# Patient Record
Sex: Male | Born: 1990 | Race: White | Hispanic: No | Marital: Single | Smoking: Never smoker
Health system: Southern US, Community
[De-identification: ages and names within clinical notes are randomized; demographics above are authoritative.]

## PROBLEM LIST (undated history)

## (undated) HISTORY — PX: TONSILLECTOMY: SUR1361

---
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
## 2012-04-22 IMAGING — CR NASAL BONES - 3+ VIEW
1 series · 3 of 3 positions shown · non-contrast
Comparison: none

REASON FOR EXAM: injury; pt in [place]
COMMENTS:

PROCEDURE:     DXR - DXR NASAL BONES  - December 10, 2010  [DATE]
RESULT:     Images of the nasal bones demonstrate minimally displaced
fracture. The nasal septum appears midline. The orbits appear intact on the
Waters' view.

[Series 1: view not recorded · 0.17mm/px · 3 of 3 slices shown]
[im 1/3]
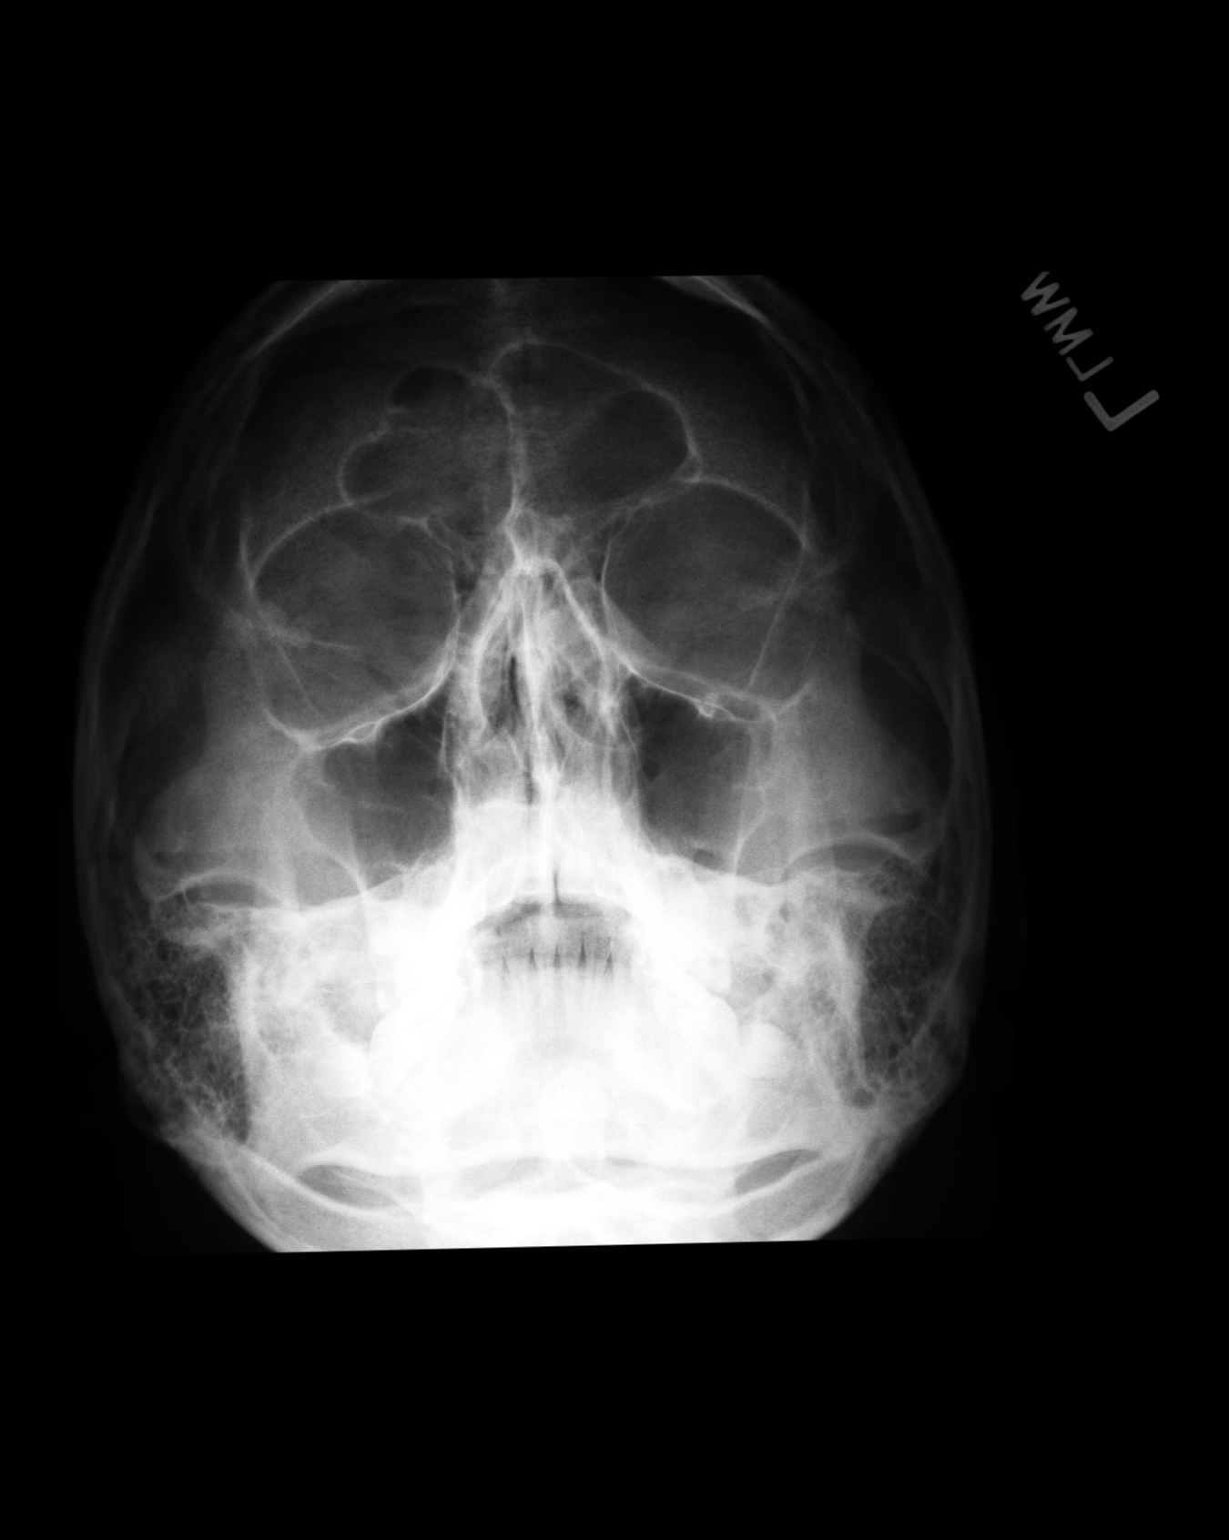
[im 2/3]
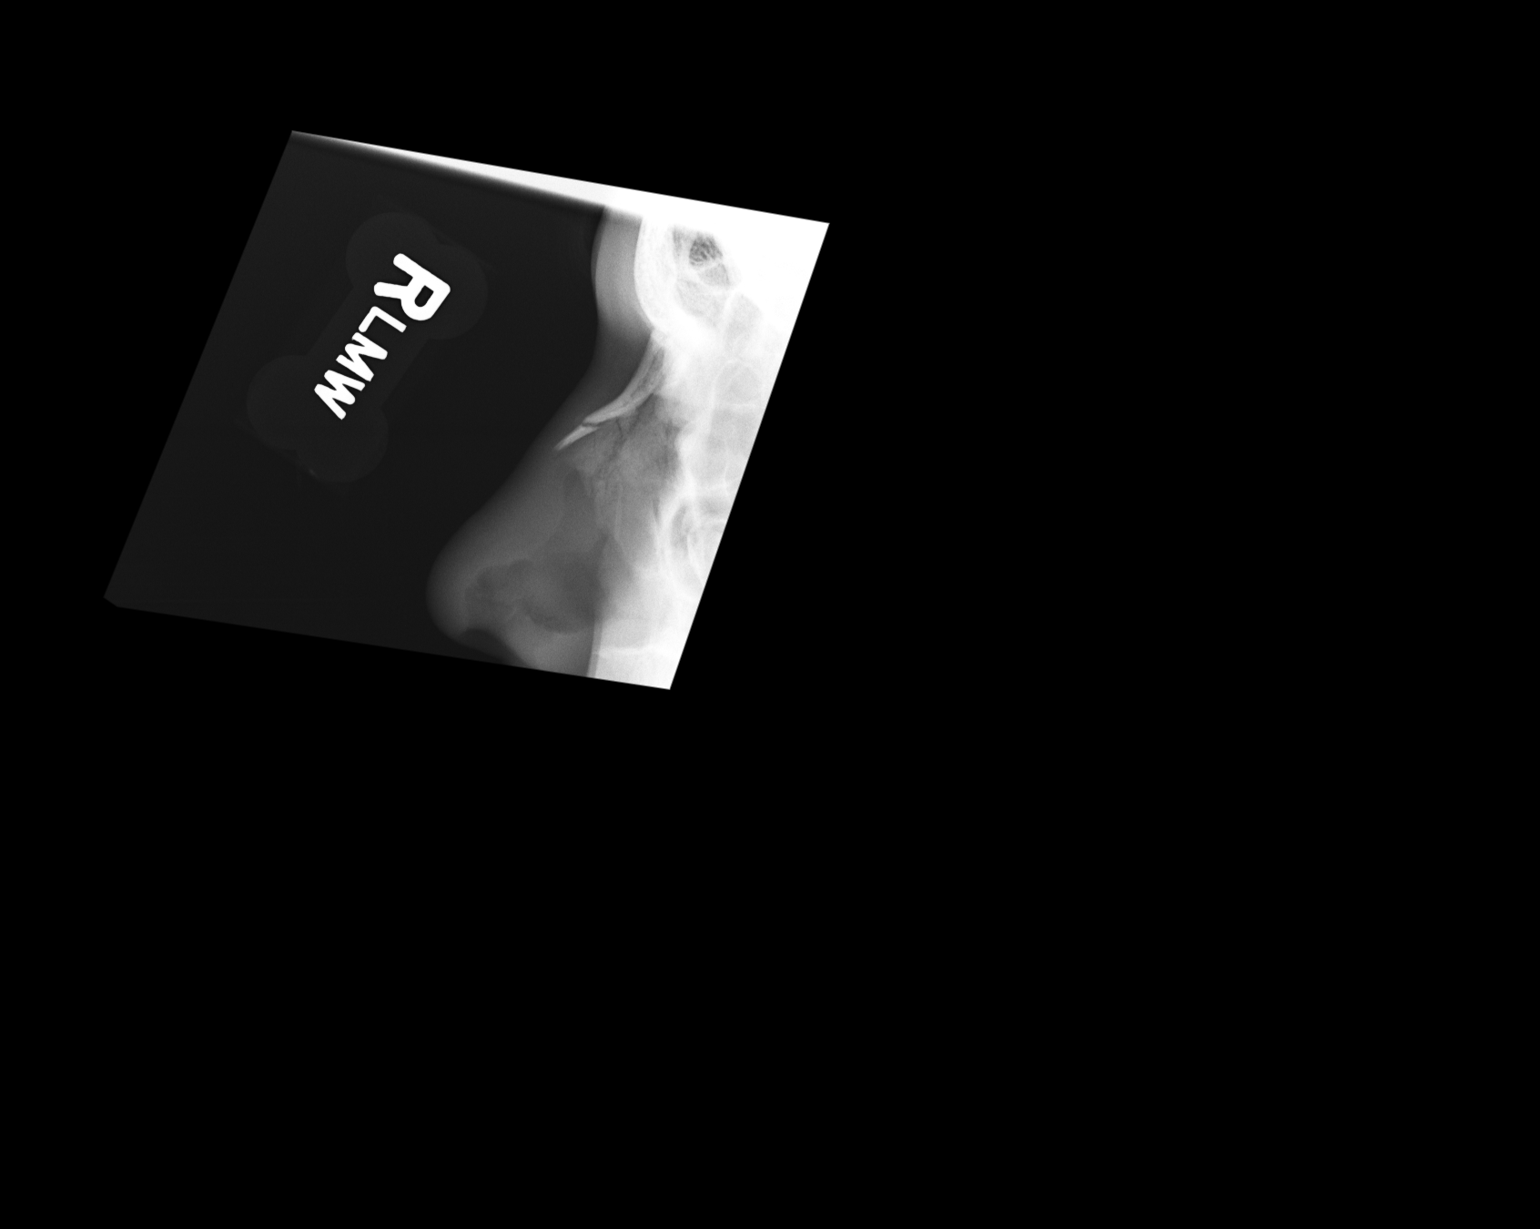
[im 3/3]
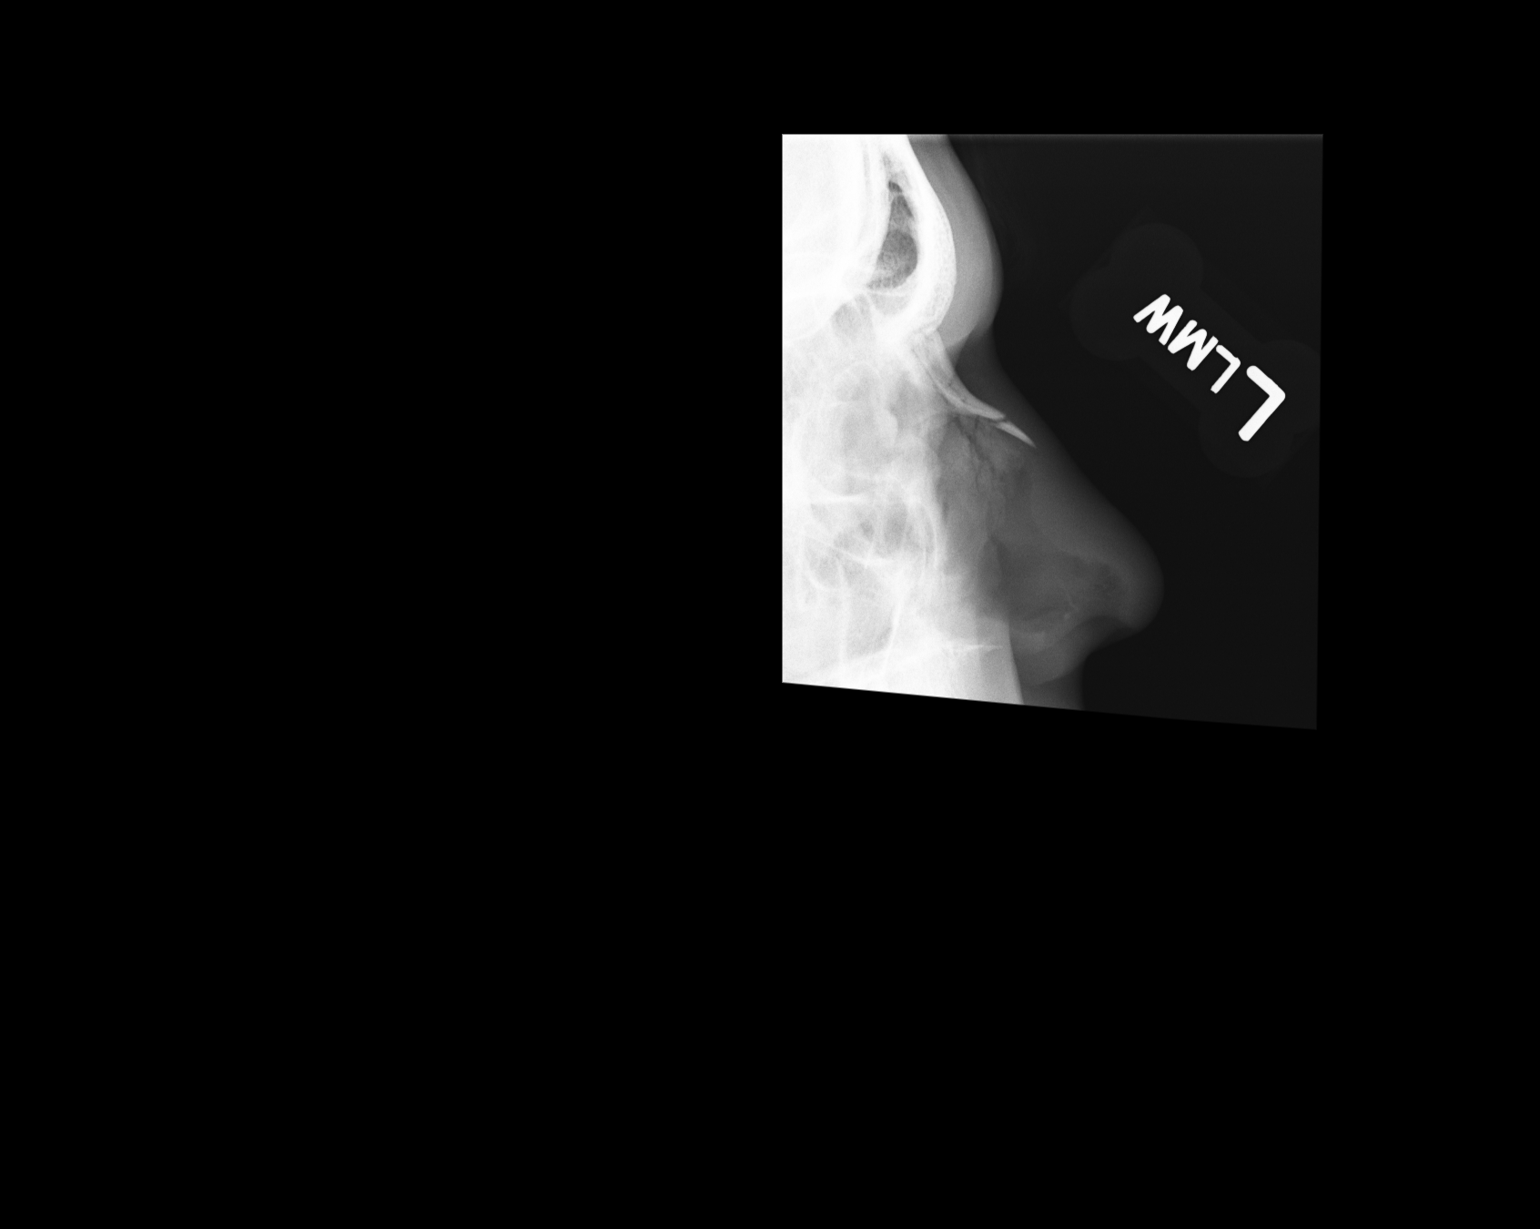

[3 of 3 positions shown; findings below may reference images not displayed]

IMPRESSION: Nasal bone fracture.

## 2012-11-11 ENCOUNTER — Ambulatory Visit: Payer: Self-pay

## 2012-11-11 LAB — RAPID STREP-A WITH REFLX: Micro Text Report: NEGATIVE

## 2012-11-11 LAB — RAPID INFLUENZA A&B ANTIGENS

## 2012-11-14 LAB — BETA STREP CULTURE(ARMC)

## 2013-04-07 ENCOUNTER — Ambulatory Visit: Payer: Self-pay | Admitting: Emergency Medicine

## 2013-04-07 LAB — RAPID STREP-A WITH REFLX: Micro Text Report: NEGATIVE

## 2013-04-07 LAB — RAPID INFLUENZA A&B ANTIGENS

## 2013-04-10 LAB — BETA STREP CULTURE(ARMC)

## 2015-01-07 ENCOUNTER — Ambulatory Visit: Payer: Self-pay | Admitting: Emergency Medicine

## 2017-01-26 ENCOUNTER — Ambulatory Visit
Admission: EM | Admit: 2017-01-26 | Discharge: 2017-01-26 | Disposition: A | Discharge status: Home / Self Care | Payer: Federal, State, Local not specified - PPO | Attending: Family Medicine | Admitting: Family Medicine

## 2017-01-26 ENCOUNTER — Encounter: Payer: Self-pay | Admitting: *Deleted

## 2017-01-26 DIAGNOSIS — J0100 Acute maxillary sinusitis, unspecified: Secondary | ICD-10-CM | POA: Diagnosis not present

## 2017-01-26 DIAGNOSIS — H6503 Acute serous otitis media, bilateral: Secondary | ICD-10-CM

## 2017-01-26 LAB — RAPID STREP SCREEN (MED CTR MEBANE ONLY): Streptococcus, Group A Screen (Direct): NEGATIVE

## 2017-01-26 MED ORDER — AMOXICILLIN 875 MG PO TABS: 875.0000 mg | ORAL_TABLET | Freq: Two times a day (BID) | ORAL | 0 refills | Status: DC

## 2017-01-26 NOTE — ED Provider Notes (Signed)
[facility]    CSN: [id number] Arrival date & time: 01/26/17  [id number]     History   Chief Complaint Chief Complaint  Patient presents with  . Sore Throat  . Facial Pain  . Generalized Body Aches  . Nasal Congestion    HPI Shawn Solis is a 26 y.o. male.   The history is provided by the patient.  Sore Throat   URI  Presenting symptoms: congestion, facial pain and rhinorrhea   Severity:  Moderate Onset quality:  Sudden Duration:  7 days Timing:  Constant Progression:  Worsening Chronicity:  New Relieved by:  None tried Ineffective treatments:  None tried Associated symptoms: sinus pain   Associated symptoms: no wheezing   Risk factors: not elderly, no chronic cardiac disease, no chronic kidney disease, no chronic respiratory disease, no diabetes mellitus, no immunosuppression, no recent illness, no recent travel and no sick contacts     History reviewed. No pertinent past medical history.  There are no active problems to display for this patient.   Past Surgical History:  Procedure Laterality Date  . TONSILLECTOMY         Home Medications    Prior to Admission medications   Medication Sig Start Date End Date Taking? Authorizing Provider  amoxicillin (AMOXIL) 875 MG tablet Take 1 tablet (875 mg total) by mouth 2 (two) times daily. 01/26/17   Shawn Mccallumrlando Any Mcneice, MD    Family History History reviewed. No pertinent family history.  Social History Social History  Substance Use Topics  . Smoking status: Never Smoker  . Smokeless tobacco: Never Used  . Alcohol use Yes     Allergies   Patient has no known allergies.   Review of Systems Review of Systems  HENT: Positive for congestion, rhinorrhea and sinus pain.   Respiratory: Negative for wheezing.      Physical Exam Triage Vital Signs ED Triage Vitals  Enc Vitals Group     BP 01/26/17 0901 125/86     Pulse Rate 01/26/17 0901 86     Resp 01/26/17 0901 16     Temp 01/26/17 0901  97.8 F (36.6 C)     Temp Source 01/26/17 0901 Oral     SpO2 01/26/17 0901 98 %     Weight 01/26/17 0904 230 lb (104.3 kg)     Height 01/26/17 0904 6\' 3"  (1.905 m)     Head Circumference --      Peak Flow --      Pain Score 01/26/17 0935 2     Pain Loc --      Pain Edu? --      Excl. in GC? --    No data found.   Updated Vital Signs BP 125/86 (BP Location: Left Arm)   Pulse 86   Temp 97.8 F (36.6 C) (Oral)   Resp 16   Ht 6\' 3"  (1.905 m)   Wt 230 lb (104.3 kg)   SpO2 98%   BMI 28.75 kg/m   Visual Acuity Right Eye Distance:   Left Eye Distance:   Bilateral Distance:    Right Eye Near:   Left Eye Near:    Bilateral Near:     Physical Exam  Constitutional: He appears well-developed and well-nourished. No distress.  HENT:  Head: Normocephalic and atraumatic.  Right Ear: External ear and ear canal normal. Tympanic membrane is erythematous and bulging. A middle ear effusion is present.  Left Ear: External ear and ear canal normal. Tympanic membrane  is erythematous and bulging. A middle ear effusion is present.  Nose: Right sinus exhibits maxillary sinus tenderness and frontal sinus tenderness. Left sinus exhibits maxillary sinus tenderness and frontal sinus tenderness.  Mouth/Throat: Uvula is midline, oropharynx is clear and moist and mucous membranes are normal. No oropharyngeal exudate or tonsillar abscesses.  Eyes: Conjunctivae and EOM are normal. Pupils are equal, round, and reactive to light. Right eye exhibits no discharge. Left eye exhibits no discharge. No scleral icterus.  Neck: Normal range of motion. Neck supple. No tracheal deviation present. No thyromegaly present.  Cardiovascular: Normal rate, regular rhythm and normal heart sounds.   Pulmonary/Chest: Effort normal and breath sounds normal. No stridor. No respiratory distress. He has no wheezes. He has no rales. He exhibits no tenderness.  Lymphadenopathy:    He has no cervical adenopathy.  Neurological: He  is alert.  Skin: Skin is warm and dry. No rash noted. He is not diaphoretic.  Nursing note and vitals reviewed.    UC Treatments / Results  Labs (all labs ordered are listed, but only abnormal results are displayed) Labs Reviewed  RAPID STREP SCREEN (NOT AT [facility])  CULTURE, GROUP A STREP [facility])    EKG  EKG Interpretation None       Radiology No results found.  Procedures Procedures (including critical care time)  Medications Ordered in UC Medications - No data to display   Initial Impression / Assessment and Plan / UC Course  I have reviewed the triage vital signs and the nursing notes.  Pertinent labs & imaging results that were available during my care of the patient were reviewed by me and considered in my medical decision making (see chart for details).       Final Clinical Impressions(s) / UC Diagnoses   Final diagnoses:  Bilateral acute serous otitis media, recurrence not specified  Acute maxillary sinusitis, recurrence not specified    New Prescriptions Discharge Medication List as of 01/26/2017  9:33 AM    START taking these medications   Details  amoxicillin (AMOXIL) 875 MG tablet Take 1 tablet (875 mg total) by mouth 2 (two) times daily., Starting Fri 01/26/2017, Normal       1. diagnosis reviewed with patient 2. rx as per orders above; reviewed possible side effects, interactions, risks and benefits  3. Recommend supportive treatment with rest, fluids 4. Follow-up prn if symptoms worsen or don't improve   Shawn Mccallum, MD 01/26/17 1230

## 2017-01-26 NOTE — ED Triage Notes (Signed)
Sore throat, facial pain, head congestion, ear fullness, body aches, x1 week.

## 2017-01-28 ENCOUNTER — Telehealth: Payer: Self-pay

## 2017-01-28 LAB — CULTURE, GROUP A STREP (THRC)

## 2017-01-28 NOTE — Telephone Encounter (Signed)
Spoke with pt. Pt. Advised of all results and verbalized understanding. MAH 

## 2020-10-10 ENCOUNTER — Other Ambulatory Visit: Payer: Self-pay

## 2020-10-10 ENCOUNTER — Encounter: Payer: Self-pay | Admitting: Emergency Medicine

## 2020-10-10 ENCOUNTER — Ambulatory Visit
Admission: EM | Admit: 2020-10-10 | Discharge: 2020-10-10 | Disposition: A | Discharge status: Home / Self Care | Payer: Managed Care, Other (non HMO) | Attending: Physician Assistant | Admitting: Physician Assistant

## 2020-10-10 DIAGNOSIS — B349 Viral infection, unspecified: Secondary | ICD-10-CM | POA: Diagnosis not present

## 2020-10-10 DIAGNOSIS — Z20822 Contact with and (suspected) exposure to covid-19: Secondary | ICD-10-CM | POA: Insufficient documentation

## 2020-10-10 DIAGNOSIS — B001 Herpesviral vesicular dermatitis: Secondary | ICD-10-CM | POA: Insufficient documentation

## 2020-10-10 DIAGNOSIS — R059 Cough, unspecified: Secondary | ICD-10-CM | POA: Insufficient documentation

## 2020-10-10 DIAGNOSIS — J029 Acute pharyngitis, unspecified: Secondary | ICD-10-CM | POA: Diagnosis not present

## 2020-10-10 LAB — RESP PANEL BY RT-PCR (FLU A&B, COVID) ARPGX2
Influenza A by PCR: NEGATIVE
Influenza B by PCR: NEGATIVE
SARS Coronavirus 2 by RT PCR: NEGATIVE

## 2020-10-10 LAB — GROUP A STREP BY PCR: Group A Strep by PCR: NOT DETECTED

## 2020-10-10 MED ORDER — VALACYCLOVIR HCL 1 G PO TABS: 2000.0000 mg | ORAL_TABLET | Freq: Two times a day (BID) | ORAL | 0 refills | Status: AC

## 2020-10-10 NOTE — Discharge Instructions (Signed)

## 2020-10-10 NOTE — ED Provider Notes (Signed)
[facility]    CSN: [id number] Arrival date & time: 10/10/20  1037      History   Chief Complaint Chief Complaint  Patient presents with  . Nasal Congestion  . Sore Throat    HPI Shawn Solis is a 29 y.o. male presenting for 2 day history of sore throat, dry cough, nasal congestion and headaches.  Patient says that he developed multiple cold sores on his lips yesterday.  He says he noticed new culture today.  Patient states that he thought his symptoms were due to allergies and has been taking allergy medicine which seems to have helped.  He is also afraid he may develop a sinus infection since he says he has a history of getting sinus infections.  Patient is most concerned about his cold sores at this point since he says they are painful and seem to be spreading.  He has applied over-the-counter Abreva without any improvement in the sores.  He denies any known Covid exposure. Patient not vaccinated for COVID 19.  Patient denies any associated fever, fatigue, body aches, sinus pain, ear pain, chest pain or breathing difficulty.  Denies any nausea/vomiting or diarrhea.  No smell or taste changes.  No other complaints or concerns.  HPI  History reviewed. No pertinent past medical history.  There are no problems to display for this patient.   Past Surgical History:  Procedure Laterality Date  . TONSILLECTOMY         Home Medications    Prior to Admission medications   Medication Sig Start Date End Date Taking? Authorizing Provider  valACYclovir (VALTREX) 1000 MG tablet Take 2 tablets (2,000 mg total) by mouth 2 (two) times daily for 1 day. 10/10/20 10/11/20  Shirlee Latch, PA-C    Family History History reviewed. No pertinent family history.  Social History Social History   Tobacco Use  . Smoking status: Never Smoker  . Smokeless tobacco: Never Used  Vaping Use  . Vaping Use: Never used  Substance Use Topics  . Alcohol use: Yes  . Drug use: Never      Allergies   Patient has no known allergies.   Review of Systems Review of Systems  Constitutional: Positive for fatigue. Negative for fever.  HENT: Positive for congestion, mouth sores (lip sores), rhinorrhea and sore throat. Negative for sinus pressure and sinus pain.   Respiratory: Positive for cough. Negative for shortness of breath.   Gastrointestinal: Negative for abdominal pain, diarrhea, nausea and vomiting.  Musculoskeletal: Negative for myalgias.  Neurological: Positive for headaches. Negative for weakness and light-headedness.  Hematological: Negative for adenopathy.     Physical Exam Triage Vital Signs ED Triage Vitals  Enc Vitals Group     BP 10/10/20 1145 (!) 135/107     Pulse Rate 10/10/20 1145 (!) 105     Resp 10/10/20 1145 16     Temp 10/10/20 1145 98.3 F (36.8 C)     Temp Source 10/10/20 1145 Oral     SpO2 10/10/20 1145 100 %     Weight 10/10/20 1142 215 lb (97.5 kg)     Height 10/10/20 1142 6\' 3"  (1.905 m)     Head Circumference --      Peak Flow --      Pain Score 10/10/20 1142 2     Pain Loc --      Pain Edu? --      Excl. in GC? --    No data found.  Updated  Vital Signs BP (!) 135/107 (BP Location: Right Arm)   Pulse (!) 105   Temp 98.3 F (36.8 C) (Oral)   Resp 16   Ht 6\' 3"  (1.905 m)   Wt 215 lb (97.5 kg)   SpO2 100%   BMI 26.87 kg/m       Physical Exam Vitals and nursing note reviewed.  Constitutional:      General: He is not in acute distress.    Appearance: Normal appearance. He is well-developed. He is not ill-appearing, toxic-appearing or diaphoretic.  HENT:     Head: Normocephalic and atraumatic.     Nose: Congestion and rhinorrhea present.     Mouth/Throat:     Pharynx: Uvula midline. Posterior oropharyngeal erythema present. No oropharyngeal exudate.     Tonsils: No tonsillar abscesses.     Comments: Multiple tender vesicles of upper and lower lips Eyes:     General: No scleral icterus.       Right eye: No  discharge.        Left eye: No discharge.     Conjunctiva/sclera: Conjunctivae normal.  Neck:     Thyroid: No thyromegaly.     Trachea: No tracheal deviation.  Cardiovascular:     Rate and Rhythm: Regular rhythm. Tachycardia present.     Heart sounds: Normal heart sounds.  Pulmonary:     Effort: Pulmonary effort is normal. No respiratory distress.     Breath sounds: Normal breath sounds. No stridor. No wheezing or rales.  Chest:     Chest wall: No tenderness.  Musculoskeletal:     Cervical back: Normal range of motion and neck supple.  Lymphadenopathy:     Cervical: No cervical adenopathy.  Skin:    General: Skin is warm and dry.     Findings: No rash.  Neurological:     General: No focal deficit present.     Mental Status: He is alert. Mental status is at baseline.     Motor: No weakness.     Gait: Gait normal.  Psychiatric:        Mood and Affect: Mood normal.        Behavior: Behavior normal.        Thought Content: Thought content normal.      UC Treatments / Results  Labs (all labs ordered are listed, but only abnormal results are displayed) Labs Reviewed  GROUP A STREP BY PCR  RESP PANEL BY RT-PCR (FLU A&B, COVID) [id number]    EKG   Radiology No results found.  Procedures Procedures (including critical care time)  Medications Ordered in UC Medications - No data to display  Initial Impression / Assessment and Plan / UC Course  I have reviewed the triage vital signs and the nursing notes.  Pertinent labs & imaging results that were available during my care of the patient were reviewed by me and considered in my medical decision making (see chart for details).   29-year male presenting for 4-day history of cough, congestion, sore throat and new onset of cold sores yesterday.  Patient is afebrile and well-appearing in exam room.  He has no sinus tenderness.  Chest is clear to auscultation.  He does have multiple cold sores on his lips.  Molecular strep test  and respiratory panel obtained.  Advised patient we will call with results.  At this point advised supportive care with increasing rest and fluids.  Advised to switch to Mucinex D or Sudafed and Flonase.  Sent Valtrex for his  cold sores.  CDC guidelines, isolation protocol and ED precautions discussed if Covid positive.  Molecular strep test and respiratory panel negative.  Final Clinical Impressions(s) / UC Diagnoses   Final diagnoses:  Viral illness  Sore throat  Cough  Herpes labialis     Discharge Instructions     You have received COVID testing today either for positive exposure, concerning symptoms that could be related to COVID infection, screening purposes, or re-testing after confirmed positive.  Your test obtained today checks for active viral infection in the last 1-2 weeks. If your test is negative now, you can still test positive later. So, if you do develop symptoms you should either get re-tested and/or isolate x 10 days. Please follow CDC guidelines.  While Rapid antigen tests come back in 15-20 minutes, send out PCR/molecular test results typically come back within 24 hours. In the mean time, if you are symptomatic, assume this could be a positive test and treat/monitor yourself as if you do have COVID.   We will call with test results. Please download the MyChart app and set up a profile to access test results.   If symptomatic, go home and rest. Push fluids. Take Tylenol as needed for discomfort. Gargle warm salt water. Throat lozenges. Take Mucinex DM or Robitussin for cough. Humidifier in bedroom to ease coughing. Warm showers. Also review the COVID handout for more information.  COVID-19 INFECTION: The incubation period of COVID-19 is approximately 14 days after exposure, with most symptoms developing in roughly 4-5 days. Symptoms may range in severity from mild to critically severe. Roughly 80% of those infected will have mild symptoms. People of any age may become  infected with COVID-19 and have the ability to transmit the virus. The most common symptoms include: fever, fatigue, cough, body aches, headaches, sore throat, nasal congestion, shortness of breath, nausea, vomiting, diarrhea, changes in smell and/or taste.    COURSE OF ILLNESS Some patients may begin with mild disease which can progress quickly into critical symptoms. If your symptoms are worsening please call ahead to the Emergency Department and proceed there for further treatment. Recovery time appears to be roughly 1-2 weeks for mild symptoms and 3-6 weeks for severe disease.   GO IMMEDIATELY TO ER FOR FEVER YOU ARE UNABLE TO GET DOWN WITH TYLENOL, BREATHING PROBLEMS, CHEST PAIN, FATIGUE, LETHARGY, INABILITY TO EAT OR DRINK, ETC  QUARANTINE AND ISOLATION: To help decrease the spread of COVID-19 please remain isolated if you have COVID infection or are highly suspected to have COVID infection. This means -stay home and isolate to one room in the home if you live with others. Do not share a bed or bathroom with others while ill, sanitize and wipe down all countertops and keep common areas clean and disinfected. You may discontinue isolation if you have a mild case and are asymptomatic 10 days after symptom onset as long as you have been fever free >24 hours without having to take Motrin or Tylenol. If your case is more severe (meaning you develop pneumonia or are admitted in the hospital), you may have to isolate longer.   If you have been in close contact (within 6 feet) of someone diagnosed with COVID 19, you are advised to quarantine in your home for 14 days as symptoms can develop anywhere from 2-14 days after exposure to the virus. If you develop symptoms, you  must isolate.  Most current guidelines for COVID after exposure -isolate 10 days if you ARE NOT tested for COVID  as long as symptoms do not develop -isolate 7 days if you are tested and remain asymptomatic -You do not necessarily need to  be tested for COVID if you have + exposure and        develop   symptoms. Just isolate at home x10 days from symptom onset During this global pandemic, CDC advises to practice social distancing, try to stay at least 104ft away from others at all times. Wear a face covering. Wash and sanitize your hands regularly and avoid going anywhere that is not necessary.  KEEP IN MIND THAT THE COVID TEST IS NOT 100% ACCURATE AND YOU SHOULD STILL DO EVERYTHING TO PREVENT POTENTIAL SPREAD OF VIRUS TO OTHERS (WEAR MASK, WEAR GLOVES, WASH HANDS AND SANITIZE REGULARLY). IF INITIAL TEST IS NEGATIVE, THIS MAY NOT MEAN YOU ARE DEFINITELY NEGATIVE. MOST ACCURATE TESTING IS DONE 5-7 DAYS AFTER EXPOSURE.   It is not advised by CDC to get re-tested after receiving a positive COVID test since you can still test positive for weeks to months after you have already cleared the virus.   *If you have not been vaccinated for COVID, I strongly suggest you consider getting vaccinated as long as there are no contraindications.      ED Prescriptions    Medication Sig Dispense Auth. Provider   valACYclovir (VALTREX) 1000 MG tablet Take 2 tablets (2,000 mg total) by mouth 2 (two) times daily for 1 day. 4 tablet Gareth Morgan     PDMP not reviewed this encounter.   Shirlee Latch, PA-C 10/10/20 1346

## 2020-10-10 NOTE — ED Triage Notes (Signed)
Patient c/o dry cough, nasal congestion, HAs and sore throat that started 2 days ago.  Patient also reports having some cold sores.  Patient denies fevers.

## 2020-11-07 ENCOUNTER — Other Ambulatory Visit: Payer: Self-pay

## 2020-11-07 ENCOUNTER — Ambulatory Visit
Admission: EM | Admit: 2020-11-07 | Discharge: 2020-11-07 | Disposition: A | Discharge status: Home / Self Care | Payer: Managed Care, Other (non HMO) | Attending: Sports Medicine | Admitting: Sports Medicine

## 2020-11-07 DIAGNOSIS — Z0189 Encounter for other specified special examinations: Secondary | ICD-10-CM

## 2020-11-07 DIAGNOSIS — U071 COVID-19: Secondary | ICD-10-CM | POA: Insufficient documentation

## 2020-11-07 NOTE — ED Triage Notes (Addendum)
Patient in today requesting a covid test due to positive covid exposure. Patient states his girlfriend, who he lives with, tested positive on 11/05/20. Patient denies any symptoms. Patient states his employer is requiring him to have a covid test.

## 2020-11-08 LAB — SARS CORONAVIRUS 2 (TAT 6-24 HRS): SARS Coronavirus 2: POSITIVE — AB

## 2021-11-22 ENCOUNTER — Ambulatory Visit
Admission: EM | Admit: 2021-11-22 | Discharge: 2021-11-22 | Disposition: A | Discharge status: Home / Self Care | Payer: Managed Care, Other (non HMO)

## 2021-11-22 ENCOUNTER — Other Ambulatory Visit: Payer: Self-pay

## 2021-11-22 DIAGNOSIS — R0989 Other specified symptoms and signs involving the circulatory and respiratory systems: Secondary | ICD-10-CM

## 2021-11-22 DIAGNOSIS — K649 Unspecified hemorrhoids: Secondary | ICD-10-CM

## 2021-11-22 DIAGNOSIS — K219 Gastro-esophageal reflux disease without esophagitis: Secondary | ICD-10-CM

## 2021-11-22 MED ORDER — PRAMOXINE HCL (PERIANAL) 1 % EX FOAM: 1.0000 "application " | Freq: Three times a day (TID) | CUTANEOUS | 0 refills | Status: AC | PRN

## 2021-11-22 NOTE — ED Triage Notes (Signed)
Pt c/o food getting caught in throat, he has had endoscopy and throat stretching. Pt has bad acid reflux since childhood, pt is here to get a referral to gastro doctor in [place]. Dr. Dickie La  [address] [facility]. [facility].

## 2021-11-22 NOTE — ED Provider Notes (Signed)
[facility]    CSN: [id number] Arrival date & time: 11/22/21  0949      History   Chief Complaint Chief Complaint  Patient presents with   throat issue   Hemorrhoids    HPI Shawn Solis is a 31 y.o. male.   HPI  31 year old male here for evaluation of gastrointestinal issues.  Patient's first issue is that he has a history of gastroesophageal reflux disease with previous food bolus impaction requiring esophageal dilatation.  He states that for the last 6 to 8 weeks he has been experiencing a sensation of his food getting stuck with swallowing.  He states this is lasted up to 30 minutes but he is able to get the food to pass he has no food stuck at present and is able to eat and drink without difficulty.  He does not currently have a primary care provider.  Patient second issue is that he has been dealing with a hemorrhoids since 11/07/2021.  He states that the hemorrhoid burns and itches and the pain increases with sitting.  He denies any fever or bleeding.  He has had 1 episode of a hemorrhoid in the past which resolved on its own after couple of days.  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  No past medical history on file.  There are no problems to display for this patient.   Past Surgical History:  Procedure Laterality Date   TONSILLECTOMY         Home Medications    Prior to Admission medications   Medication Sig Start Date End Date Taking? Authorizing Provider  omeprazole (PRILOSEC) 20 MG capsule Take by mouth. 05/31/17  Yes [provider]  pramoxine (PROCTOFOAM) 1 % foam Place 1 application rectally 3 (three) times daily as needed for anal itching. 11/22/21  Yes Becky Augustayan, Kajah Santizo, NP    Family History No family history on file.  Social History Social History   Tobacco Use   Smoking status: Never   Smokeless tobacco: Never  Vaping Use   Vaping Use: Never used  Substance Use Topics   Alcohol  use: Yes   Drug use: Never     Allergies   Patient has no known allergies.   Review of Systems Review of Systems  Constitutional:  Negative for activity change, appetite change and fever.  Gastrointestinal:  Positive for rectal pain. Negative for anal bleeding, blood in stool and constipation.    Physical Exam Triage Vital Signs ED Triage Vitals  Enc Vitals Group     BP 11/22/21 1008 (!) 143/108     Pulse Rate 11/22/21 1008 95     Resp 11/22/21 1008 16     Temp 11/22/21 1012 98.4 F (36.9 C)     Temp Source 11/22/21 1008 Oral     SpO2 11/22/21 1008 100 %     Weight 11/22/21 1007 214 lb 15.2 oz (97.5 kg)     Height 11/22/21 1007 6\' 3"  (1.905 m)     Head Circumference --      Peak Flow --      Pain Score 11/22/21 1007 0     Pain Loc --      Pain Edu? --      Excl. in GC? --    No data found.  Updated Vital Signs BP (!) 143/108 (BP Location: Left Arm)    Pulse 95    Temp 98.4 F (36.9 C) (Oral)  Resp 16    Ht 6\' 3"  (1.905 m)    Wt 214 lb 15.2 oz (97.5 kg)    SpO2 100%    BMI 26.87 kg/m   Visual Acuity Right Eye Distance:   Left Eye Distance:   Bilateral Distance:    Right Eye Near:   Left Eye Near:    Bilateral Near:     Physical Exam Vitals and nursing note reviewed.  Constitutional:      General: He is not in acute distress.    Appearance: Normal appearance. He is not ill-appearing.  HENT:     Head: Normocephalic and atraumatic.  Genitourinary:    Comments: Patient has a large, nonthrombosed hemorrhoid that extends from the 1 to 3 o'clock position.  Hard and tender to touch.  No bleeding. Skin:    General: Skin is warm and dry.     Capillary Refill: Capillary refill takes less than 2 seconds.     Findings: No erythema or rash.  Neurological:     General: No focal deficit present.     Mental Status: He is alert and oriented to person, place, and time.  Psychiatric:        Mood and Affect: Mood normal.        Behavior: Behavior normal.         Thought Content: Thought content normal.        Judgment: Judgment normal.     UC Treatments / Results  Labs (all labs ordered are listed, but only abnormal results are displayed) Labs Reviewed - No data to display  EKG   Radiology No results found.  Procedures Procedures (including critical care time)  Medications Ordered in UC Medications - No data to display  Initial Impression / Assessment and Plan / UC Course  I have reviewed the triage vital signs and the nursing notes.  Pertinent labs & imaging results that were available during my care of the patient were reviewed by me and considered in my medical decision making (see chart for details).  Patient is a nontoxic-appearing 31 year old male here for evaluation of hemorrhoid that is been present since 11/07/2021 that burns, itches, and has increased pain with sitting.  No bleeding or fever.  He is also requesting a referral to Dr. 11/09/2021 at [facility] for evaluation of his esophagus.  He has a history of food bolus impaction in the past as well as GERD.  He takes omeprazole daily.  He has had to have 1 food bolus removed in the past and reports that last 6 to 8 weeks he has had an increased feeling of food being stuck when he swallows despite swallowing with liquids and following each bite with more liquids.  He states he does not have that sensation presently but he wants a referral to a gastroenterologist for evaluation before he wants with another food bolus impaction.  Patient physical exam reveals of flesh-colored hemorrhoid at the rectal opening from the 1:00 to 3 o'clock position that is not thrombosed.  It is hard and tender to touch.  We will treat the hemorrhoid with Proctofoam, Tucks pads, and over-the-counter Colace stool softener.  I also cautioned the patient against straining to either have a bowel movement or with heavy lifting as this can make the existing hemorrhoid worse and cause more to form.   I was unable to make a referral to [facility] at [facility] but I was able to make a referral to Dr. [facility] at  [facility] gastroenterology here and [place].   Final Clinical Impressions(s) / UC Diagnoses   Final diagnoses:  Hemorrhoids, unspecified hemorrhoid type  Globus sensation  Gastroesophageal reflux disease, unspecified whether esophagitis present     Discharge Instructions      Use the proctofoam 3 times a day to shrink your hemorrhoid.  Use OTC Colace to soften your stool and prevent straining with defecation.  Increase your water intake by mouth to prevent hard stood from forming.  Be mindful of straining with heavy lifting as this can cause more hemorrhoids and make your existing hemorrhoids worse.  You can also use OTC Tucks pads to decrease the size of your hemorrhoid and help with itching.  I have referred you to [facility] gastroenterology for evaluation and endoscopy to address your esophageal stricture.  Iof you feel like your food becomes stuck and you cannot dislodge the food then you need to go back to the ER.      ED Prescriptions     Medication Sig Dispense Auth. Provider   pramoxine (PROCTOFOAM) 1 % foam Place 1 application rectally 3 (three) times daily as needed for anal itching. 15 g Becky Augusta, NP      PDMP not reviewed this encounter.   Becky Augusta, NP 11/22/21 1137

## 2021-11-22 NOTE — Discharge Instructions (Signed)
Use the proctofoam 3 times a day to shrink your hemorrhoid.  Use OTC Colace to soften your stool and prevent straining with defecation.  Increase your water intake by mouth to prevent hard stood from forming.  Be mindful of straining with heavy lifting as this can cause more hemorrhoids and make your existing hemorrhoids worse.  You can also use OTC Tucks pads to decrease the size of your hemorrhoid and help with itching.  I have referred you to [facility] gastroenterology for evaluation and endoscopy to address your esophageal stricture.  Iof you feel like your food becomes stuck and you cannot dislodge the food then you need to go back to the ER.

## 2024-04-26 ENCOUNTER — Encounter: Payer: Self-pay | Admitting: Emergency Medicine

## 2024-04-26 ENCOUNTER — Ambulatory Visit
Admission: EM | Admit: 2024-04-26 | Discharge: 2024-04-26 | Disposition: A | Discharge status: Home / Self Care | Payer: Self-pay | Attending: Emergency Medicine | Admitting: Emergency Medicine

## 2024-04-26 DIAGNOSIS — L247 Irritant contact dermatitis due to plants, except food: Secondary | ICD-10-CM

## 2024-04-26 MED ORDER — DEXAMETHASONE SODIUM PHOSPHATE 10 MG/ML IJ SOLN
10.0000 mg | Freq: Once | INTRAMUSCULAR | Status: AC
  Administered 2024-04-26: 10 mg via INTRAMUSCULAR

## 2024-04-26 MED ORDER — PREDNISONE 10 MG (21) PO TBPK: ORAL_TABLET | Freq: Every day | ORAL | 0 refills | Status: AC

## 2024-04-26 NOTE — ED Provider Notes (Signed)
 [facility]    CSN: [id number] Arrival date & time: 04/26/24  0802      History   Chief Complaint Chief Complaint  Patient presents with   Rash    HPI Shawn Solis is a 33 y.o. male.   HPI  33 year old male with no significant past medical history presents for evaluation of an itchy rash on his hands, arms, and face.  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  He states that the rash began as an itch Wednesday night into 06-05-2024 morning and then became more pronounced 2024-06-05 and 06-Jun-2024.  He does have some lesions around his left eye but he denies any changes in vision.  History reviewed. No pertinent past medical history.  There are no active problems to display for this patient.   Past Surgical History:  Procedure Laterality Date   TONSILLECTOMY         Home Medications    Prior to Admission medications   Medication Sig Start Date End Date Taking? Authorizing Provider  omeprazole (PRILOSEC) 20 MG capsule Take by mouth. 05/31/17  Yes [provider]  predniSONE (STERAPRED UNI-PAK 21 TAB) 10 MG (21) TBPK tablet Take by mouth daily. Take 6 tabs by mouth daily  for 2 days, then 5 tabs for 2 days, then 4 tabs for 2 days, then 3 tabs for 2 days, 2 tabs for 2 days, then 1 tab by mouth daily for 2 days 04/26/24  Yes Kent Pear, NP  pramoxine (PROCTOFOAM) 1 % foam Place 1 application rectally 3 (three) times daily as needed for anal itching. 11/22/21   Kent Pear, NP    Family History History reviewed. No pertinent family history.  Social History Social History   Tobacco Use   Smoking status: Never   Smokeless tobacco: Never  Vaping Use   Vaping status: Never Used  Substance Use Topics   Alcohol use: Yes   Drug use: Never     Allergies   Patient has no known allergies.   Review of Systems Review of Systems   Eyes:  Negative for visual disturbance.  Skin:  Positive for rash.     Physical Exam Triage Vital Signs ED Triage Vitals [04/26/24 0810]  Encounter Vitals Group     BP      Girls Systolic BP Percentile      Girls Diastolic BP Percentile      Boys Systolic BP Percentile      Boys Diastolic BP Percentile      Pulse      Resp      Temp      Temp src      SpO2      Weight 214 lb 15.2 oz (97.5 kg)     Height 6' 3 (1.905 m)     Head Circumference      Peak Flow      Pain Score 0     Pain Loc      Pain Education      Exclude from Growth Chart    No data found.  Updated Vital Signs BP (!) 153/99 (BP Location: Right Arm)   Pulse 78   Temp 97.8 F (36.6 C) (Oral)   Resp 15   Ht 6' 3 (1.905 m)   Wt 214 lb 15.2 oz (97.5 kg)  SpO2 98%   BMI 26.87 kg/m   Visual Acuity Right Eye Distance:   Left Eye Distance:   Bilateral Distance:    Right Eye Near:   Left Eye Near:    Bilateral Near:     Physical Exam Vitals and nursing note reviewed.  Constitutional:      Appearance: Normal appearance.   Eyes:     Extraocular Movements: Extraocular movements intact.     Conjunctiva/sclera: Conjunctivae normal.     Pupils: Pupils are equal, round, and reactive to light.    Skin:    General: Skin is warm and dry.     Capillary Refill: Capillary refill takes less than 2 seconds.     Findings: Erythema and rash present.   Neurological:     General: No focal deficit present.     Mental Status: He is alert and oriented to person, place, and time.      UC Treatments / Results  Labs (all labs ordered are listed, but only abnormal results are displayed) Labs Reviewed - No data to display  EKG   Radiology No results found.  Procedures Procedures (including critical care time)  Medications Ordered in UC Medications  dexamethasone (DECADRON) injection 10 mg (has no administration in time range)    Initial Impression / Assessment and Plan / UC Course  I have  reviewed the triage vital signs and the nursing notes.  Pertinent labs & imaging results that were available during my care of the patient were reviewed by me and considered in my medical decision making (see chart for details).   Patient is a pleasant, nontoxic-appearing 33 year old male presenting for evaluation of contact dermatitis that began 3 days ago.  He has lesions on both hands, between his fingers, and on both forearms.  He also has lesions on the left side of his forehead and around his left eye.  His pupils equal round and reactive and his EOM is intact.  His sclera is white and quiet.  No discharge.  He is not endorsing any visual changes.  I will order visual acuity to document his current visual status.        Patient consumed images above, the rash is erythematous and raised but there are no pustules or vesicles that have formed as of yet.  I will order 10 mg of IM Decadron here in clinic and discharge the patient home on a 12-day prednisone taper.  He has been using Zyrtec and Benadryl for itching and I have encouraged him to continue doing so.  He may also apply calamine lotion to the lesions on his body but he should not apply to his face.  I have also advised the patient that if he develops any changes to his vision that he needs to be evaluated by an ophthalmologist.  Given that it is the weekend he would have to seek care through the emergency department.  Visual acuity OU 20/20, OD 20/25, OS 20/20.   Final Clinical Impressions(s) / UC Diagnoses   Final diagnoses:  Irritant contact dermatitis due to plants, except food     Discharge Instructions      Take the prednisone according to the package instructions.  Use over-the-counter Allegra, Claritin, or Zyrtec during the day as needed for itching and use Benadryl 50 mg at bedtime.  This may also help you sleep as a steroids may interrupt your sleep cycle.  Apply calamine lotion to the rash on your extremities to  help dry it up.  Do not use calamine lotion on your face.  For facial lesions, if you develop any changes in your vision or itching and irritation in your eyes please go to the ER for evaluation or follow-up with ophthalmology.      ED Prescriptions     Medication Sig Dispense Auth. Provider   predniSONE (STERAPRED UNI-PAK 21 TAB) 10 MG (21) TBPK tablet Take by mouth daily. Take 6 tabs by mouth daily  for 2 days, then 5 tabs for 2 days, then 4 tabs for 2 days, then 3 tabs for 2 days, 2 tabs for 2 days, then 1 tab by mouth daily for 2 days 42 tablet Kent Pear, NP      PDMP not reviewed this encounter.   Kent Pear, NP 04/26/24 [phone]

## 2024-04-26 NOTE — Discharge Instructions (Addendum)
Take the prednisone according to the package instructions.  Use over-the-counter Allegra, Claritin, or Zyrtec during the day as needed for itching and use Benadryl 50 mg at bedtime.  This may also help you sleep as a steroids may interrupt your sleep cycle.  Apply calamine lotion to the rash on your extremities to help dry it up.  Do not use calamine lotion on your face.  For facial lesions, if you develop any changes in your vision or itching and irritation in your eyes please go to the ER for evaluation or follow-up with ophthalmology.  

## 2024-04-26 NOTE — ED Triage Notes (Signed)
 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  Patient reports itchy rash on his arms and has since spread to his hands and face.

## 2024-06-15 ENCOUNTER — Emergency Department
Admission: EM | Admit: 2024-06-15 | Discharge: 2024-06-15 | Disposition: A | Discharge status: Home / Self Care | Payer: MEDICAID | Attending: Emergency Medicine | Admitting: Emergency Medicine

## 2024-06-15 ENCOUNTER — Other Ambulatory Visit: Payer: Self-pay

## 2024-06-15 DIAGNOSIS — R112 Nausea with vomiting, unspecified: Secondary | ICD-10-CM | POA: Diagnosis present

## 2024-06-15 DIAGNOSIS — E860 Dehydration: Secondary | ICD-10-CM | POA: Insufficient documentation

## 2024-06-15 LAB — COMPREHENSIVE METABOLIC PANEL WITH GFR
ALT: 29 U/L (ref 0–44)
AST: 19 U/L (ref 15–41)
Albumin: 4.2 g/dL (ref 3.5–5.0)
Alkaline Phosphatase: 64 U/L (ref 38–126)
Anion gap: 9 (ref 5–15)
BUN: 11 mg/dL (ref 6–20)
CO2: 22 mmol/L (ref 22–32)
Calcium: 9 mg/dL (ref 8.9–10.3)
Chloride: 107 mmol/L (ref 98–111)
Creatinine, Ser: 1.34 mg/dL — ABNORMAL HIGH (ref 0.61–1.24)
GFR, Estimated: >60 mL/min (ref >60)
Glucose, Bld: 106 mg/dL — ABNORMAL HIGH (ref 70–99)
Potassium: 3.7 mmol/L (ref 3.5–5.1)
Sodium: 138 mmol/L (ref 135–145)
Total Bilirubin: 0.5 mg/dL (ref 0.0–1.2)
Total Protein: 7.9 g/dL (ref 6.5–8.1)

## 2024-06-15 LAB — CBC
HCT: 46.9 % (ref 39.0–52.0)
Hemoglobin: 16.3 g/dL (ref 13.0–17.0)
MCH: 30.9 pg (ref 26.0–34.0)
MCHC: 34.8 g/dL (ref 30.0–36.0)
MCV: 88.8 fL (ref 80.0–100.0)
Platelets: 227 K/uL (ref 150–400)
RBC: 5.28 MIL/uL (ref 4.22–5.81)
RDW: 12.6 % (ref 11.5–15.5)
WBC: 8 K/uL (ref 4.0–10.5)
nRBC: 0 % (ref 0.0–0.2)

## 2024-06-15 LAB — URINALYSIS, ROUTINE W REFLEX MICROSCOPIC
Bilirubin Urine: NEGATIVE
Glucose, UA: NEGATIVE mg/dL
Hgb urine dipstick: NEGATIVE
Ketones, ur: NEGATIVE mg/dL
Leukocytes,Ua: NEGATIVE
Nitrite: NEGATIVE
Protein, ur: NEGATIVE mg/dL
Specific Gravity, Urine: 1.002 — ABNORMAL LOW (ref 1.005–1.030)
pH: 6 (ref 5.0–8.0)

## 2024-06-15 LAB — MAGNESIUM: Magnesium: 1.6 mg/dL — ABNORMAL LOW (ref 1.7–2.4)

## 2024-06-15 LAB — LIPASE, BLOOD: Lipase: 31 U/L (ref 11–51)

## 2024-06-15 LAB — CK: Total CK: 88 U/L (ref 49–397)

## 2024-06-15 LAB — TROPONIN I (HIGH SENSITIVITY): Troponin I (High Sensitivity): 3 ng/L (ref <18)

## 2024-06-15 MED ORDER — MAGNESIUM SULFATE 2 GM/50ML IV SOLN
2.0000 g | Freq: Once | INTRAVENOUS | Status: AC
  Administered 2024-06-15: 2 g via INTRAVENOUS
  Filled 2024-06-15: qty 50

## 2024-06-15 MED ORDER — ONDANSETRON HCL 4 MG/2ML IJ SOLN
4.0000 mg | Freq: Once | INTRAMUSCULAR | Status: AC
  Administered 2024-06-15: 4 mg via INTRAVENOUS
  Filled 2024-06-15: qty 2

## 2024-06-15 MED ORDER — ACETAMINOPHEN 500 MG PO TABS
1000.0000 mg | ORAL_TABLET | Freq: Once | ORAL | Status: AC
Start: 2024-06-15 — End: 2024-06-15
  Administered 2024-06-15: 1000 mg via ORAL
  Filled 2024-06-15: qty 2

## 2024-06-15 MED ORDER — LACTATED RINGERS IV BOLUS
2000.0000 mL | Freq: Once | INTRAVENOUS | Status: AC
  Administered 2024-06-15: 2000 mL via INTRAVENOUS

## 2024-06-15 NOTE — ED Triage Notes (Addendum)
 Pt comes with c/o possible dehydration. Pt states this started on Thursday. Pt states he hasn't been able to keep anything down. Pt states vomiting. Pt states his whole body hurts. Pt states dec urine output and dark colored. Pt states lower back pain. 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

## 2024-06-15 NOTE — Discharge Instructions (Addendum)
 Please use a one-to-one electrolyte to water solution over the next few days to continue rehydration.

## 2024-06-15 NOTE — ED Notes (Addendum)
 Pt reporting to ED tingling and worsening pain in bilateral legs. EDP made aware.

## 2024-06-15 NOTE — ED Provider Notes (Signed)
 [facility] Provider Note   Event Date/Time   First MD Initiated Contact with Patient 06/15/24 1457     (approximate) History  Dehydration  HPI Shawn Solis is a 33 y.o. male with no stated past medical history who presents complaining of nausea/vomiting, generalized fatigue and bodyaches, and occasional pins and needle sensation in bilateral lower extremities.  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  Patient states that 3 days ago he also had multiple episodes of nausea and vomiting.  Patient denies any persistent nausea/vomiting or any diarrhea. ROS: Patient currently denies any vision changes, tinnitus, difficulty speaking, facial droop, sore throat, chest pain, shortness of breath, abdominal pain, nausea/vomiting/diarrhea, dysuria, or weakness/numbness/paresthesias in any extremity   Physical Exam  Triage Vital Signs: ED Triage Vitals  Encounter Vitals Group     BP 06/15/24 1435 128/89     Girls Systolic BP Percentile --      Girls Diastolic BP Percentile --      Boys Systolic BP Percentile --      Boys Diastolic BP Percentile --      Pulse Rate 06/15/24 1435 94     Resp 06/15/24 1435 18     Temp 06/15/24 1435 (!) 97.5 F (36.4 C)     Temp Source 06/15/24 1435 Oral     SpO2 06/15/24 1435 95 %     Weight 06/15/24 1433 260 lb (117.9 kg)     Height 06/15/24 1433 6' 3 (1.905 m)     Head Circumference --      Peak Flow --      Pain Score 06/15/24 1433 7     Pain Loc --      Pain Education --      Exclude from Growth Chart --    Most recent vital signs: Vitals:   06/15/24 1500 06/15/24 1530  BP: 124/84   Pulse: (!) 104 (!) 103  Resp: 18   Temp:    SpO2: 94% 100%   General: Awake, oriented x4. CV:  Good peripheral perfusion. Resp:  Normal effort. Abd:  No distention. Other:  Middle-aged obese  Caucasian male resting comfortably in no acute distress ED Results / Procedures / Treatments  Labs (all labs ordered are listed, but only abnormal results are displayed) Labs Reviewed  COMPREHENSIVE METABOLIC PANEL WITH GFR - Abnormal; Notable for the following components:      Result Value   Glucose, Bld 106 (*)    Creatinine, Ser 1.34 (*)    All other components within normal limits  URINALYSIS, ROUTINE W REFLEX MICROSCOPIC - Abnormal; Notable for the following components:   Color, Urine STRAW (*)    APPearance CLEAR (*)    Specific Gravity, Urine 1.002 (*)    All other components within normal limits  MAGNESIUM  - Abnormal; Notable for the following components:   Magnesium  1.6 (*)    All other components within normal limits  LIPASE, BLOOD  CBC  CK  TROPONIN I (HIGH SENSITIVITY)   EKG ED ECG REPORT I, Artist MARLA Kerns, the attending physician, personally viewed and interpreted this ECG. Date: 06/15/2024 EKG Time: 1544 Rate: 102 Rhythm: Tachycardic sinus rhythm QRS Axis: normal Intervals: normal ST/T Wave abnormalities: normal Narrative Interpretation: Tachycardic sinus rhythm.  No evidence of acute ischemia  PROCEDURES: Critical Care performed: No Procedures MEDICATIONS ORDERED  IN ED: Medications  ondansetron  (ZOFRAN ) injection 4 mg (4 mg Intravenous Given 06/15/24 1447)  lactated ringers  bolus 2,000 mL (0 mLs Intravenous Stopped 06/15/24 1635)  acetaminophen  (TYLENOL ) tablet 1,000 mg (1,000 mg Oral Given 06/15/24 1539)  magnesium  sulfate IVPB 2 g 50 mL (0 g Intravenous Stopped 06/15/24 1635)   IMPRESSION / MDM / ASSESSMENT AND PLAN / ED COURSE  I reviewed the triage vital signs and the nursing notes.                             The patient is on the cardiac monitor to evaluate for evidence of arrhythmia and/or significant heart rate changes. Patient's presentation is most consistent with acute presentation with potential threat to life or bodily function. This patient  presents with generalized weakness and fatigue likely secondary to dehydration. Suspect acute kidney injury of prerenal origin. Doubt intrinsic renal dysfunction or obstructive nephropathy. Considered alternate etiologies of the patient's symptoms including infectious processes, severe metabolic derangements or electrolyte abnormalities, ischemia/ACS, heart failure, and intracranial/central processes but think these are unlikely given the history and physical exam.  Plan: labs, 2L LR fluid resuscitation, pain/nausea control, reassessment  Dispo: Discharge home with PCP follow-up for creatinine trend   FINAL CLINICAL IMPRESSION(S) / ED DIAGNOSES   Final diagnoses:  Dehydration, moderate  Hypomagnesemia   Rx / DC Orders   ED Discharge Orders     None      Note:  This document was prepared using Dragon voice recognition software and may include unintentional dictation errors.   Berdie Malter K, MD 06/15/24 [phone]

## 2024-06-15 NOTE — ED Notes (Signed)
 Pt reports intermittent CP and SOB. EKG done. EDP made aware. Troponin added
